# Patient Record
Sex: Female | Born: 1970 | Race: White | Hispanic: Yes | Marital: Married | Smoking: Never smoker
Health system: Southern US, Community
[De-identification: ages and names within clinical notes are randomized; demographics above are authoritative.]

## PROBLEM LIST (undated history)

## (undated) DIAGNOSIS — N186 End stage renal disease: Secondary | ICD-10-CM

## (undated) DIAGNOSIS — I10 Essential (primary) hypertension: Secondary | ICD-10-CM

## (undated) HISTORY — PX: AV FISTULA INSERTION W/ RF MAGNETIC GUIDANCE: CATH118308

---

## 2019-07-27 ENCOUNTER — Other Ambulatory Visit: Payer: Self-pay

## 2019-07-27 ENCOUNTER — Emergency Department (HOSPITAL_COMMUNITY)
Admission: EM | Admit: 2019-07-27 | Discharge: 2019-07-27 | Disposition: A | Discharge status: Home / Self Care | Payer: Self-pay | Attending: Emergency Medicine | Admitting: Emergency Medicine

## 2019-07-27 ENCOUNTER — Emergency Department (HOSPITAL_COMMUNITY): Payer: Self-pay

## 2019-07-27 ENCOUNTER — Encounter (HOSPITAL_COMMUNITY): Payer: Self-pay | Admitting: Student

## 2019-07-27 DIAGNOSIS — R05 Cough: Secondary | ICD-10-CM

## 2019-07-27 DIAGNOSIS — Z20822 Contact with and (suspected) exposure to covid-19: Secondary | ICD-10-CM

## 2019-07-27 DIAGNOSIS — R059 Cough, unspecified: Secondary | ICD-10-CM

## 2019-07-27 DIAGNOSIS — Z992 Dependence on renal dialysis: Secondary | ICD-10-CM | POA: Insufficient documentation

## 2019-07-27 DIAGNOSIS — I120 Hypertensive chronic kidney disease with stage 5 chronic kidney disease or end stage renal disease: Secondary | ICD-10-CM | POA: Insufficient documentation

## 2019-07-27 DIAGNOSIS — N186 End stage renal disease: Secondary | ICD-10-CM | POA: Insufficient documentation

## 2019-07-27 DIAGNOSIS — R509 Fever, unspecified: Secondary | ICD-10-CM

## 2019-07-27 DIAGNOSIS — U071 COVID-19: Secondary | ICD-10-CM | POA: Insufficient documentation

## 2019-07-27 HISTORY — DX: Essential (primary) hypertension: I10

## 2019-07-27 HISTORY — DX: End stage renal disease: N18.6

## 2019-07-27 LAB — COMPREHENSIVE METABOLIC PANEL
ALT: 30 U/L (ref 0–44)
AST: 31 U/L (ref 15–41)
Albumin: 4 g/dL (ref 3.5–5.0)
Alkaline Phosphatase: 139 U/L — ABNORMAL HIGH (ref 38–126)
Anion gap: 14 (ref 5–15)
BUN: 22 mg/dL — ABNORMAL HIGH (ref 6–20)
CO2: 29 mmol/L (ref 22–32)
Calcium: 8.8 mg/dL — ABNORMAL LOW (ref 8.9–10.3)
Chloride: 93 mmol/L — ABNORMAL LOW (ref 98–111)
Creatinine, Ser: 4.4 mg/dL — ABNORMAL HIGH (ref 0.44–1.00)
GFR calc Af Amer: 13 mL/min — ABNORMAL LOW (ref >60)
GFR calc non Af Amer: 11 mL/min — ABNORMAL LOW (ref >60)
Glucose, Bld: 143 mg/dL — ABNORMAL HIGH (ref 70–99)
Potassium: 4.7 mmol/L (ref 3.5–5.1)
Sodium: 136 mmol/L (ref 135–145)
Total Bilirubin: 0.6 mg/dL (ref 0.3–1.2)
Total Protein: 7.4 g/dL (ref 6.5–8.1)

## 2019-07-27 LAB — CBC
HCT: 30 % — ABNORMAL LOW (ref 36.0–46.0)
Hemoglobin: 9.9 g/dL — ABNORMAL LOW (ref 12.0–15.0)
MCH: 34.5 pg — ABNORMAL HIGH (ref 26.0–34.0)
MCHC: 33 g/dL (ref 30.0–36.0)
MCV: 104.5 fL — ABNORMAL HIGH (ref 80.0–100.0)
Platelets: 122 10*3/uL — ABNORMAL LOW (ref 150–400)
RBC: 2.87 MIL/uL — ABNORMAL LOW (ref 3.87–5.11)
RDW: 15 % (ref 11.5–15.5)
WBC: 4.1 10*3/uL (ref 4.0–10.5)
nRBC: 0 % (ref 0.0–0.2)

## 2019-07-27 LAB — INFLUENZA PANEL BY PCR (TYPE A & B)
Influenza A By PCR: NEGATIVE
Influenza B By PCR: NEGATIVE

## 2019-07-27 LAB — SARS CORONAVIRUS 2 (TAT 6-24 HRS): SARS Coronavirus 2: POSITIVE — AB

## 2019-07-27 MED ORDER — ACETAMINOPHEN 325 MG PO TABS
650.0000 mg | ORAL_TABLET | Freq: Once | ORAL | Status: AC
  Administered 2019-07-27: 650 mg via ORAL
  Filled 2019-07-27: qty 2

## 2019-07-27 NOTE — Discharge Instructions (Signed)
You likely have coronavirus, you have a test pending you will be called with positive results in about 1 day.  Please continue to quarantine at home and monitor your symptoms closely. You chest x-ray was clear. Antibiotics are not helpful in treating viral infection, the virus should run its course in about 10-14 days. Please make sure you are drinking plenty of fluids. You can treat your symptoms supportively with tylenol for fevers and pains, and over the counter cough syrups and throat lozenges to help with cough. If your symptoms are not improving please follow up with you Primary doctor.   Please call your dialysis center tomorrow to organize dialysis, you will need to inform them of your coronavirus test results.  I recommend that you purchase a home pulse ox to help better monitor your oxygen at home, if you start to have increased work of breathing or shortness of breath or your oxygen drops below 90% please immediately return to the hospital for reevaluation.  If you develop persistent fevers, shortness of breath or difficulty breathing, chest pain, severe headache and neck pain, persistent nausea and vomiting or other new or concerning symptoms return to the Emergency department.
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
## 2019-07-27 NOTE — ED Notes (Signed)
[employer]

## 2019-07-27 NOTE — ED Triage Notes (Signed)
Pt here from dialysis center with c/o fever and cough , pt did finish her treatment

## 2019-07-27 NOTE — ED Notes (Signed)
Patient verbalizes understanding of discharge instructions. Opportunity for questioning and answers were provided. Armband removed by staff, pt discharged from ED. Pt. ambulatory and discharged home.  

## 2019-07-27 NOTE — ED Triage Notes (Signed)
Pt does endorse a slight cough

## 2019-08-07 ENCOUNTER — Other Ambulatory Visit: Payer: Self-pay

## 2019-08-07 ENCOUNTER — Encounter (HOSPITAL_COMMUNITY): Payer: Self-pay | Admitting: Obstetrics and Gynecology

## 2019-08-07 ENCOUNTER — Emergency Department (HOSPITAL_COMMUNITY)
Admission: EM | Admit: 2019-08-07 | Discharge: 2019-08-07 | Disposition: A | Discharge status: Home / Self Care | Payer: No Typology Code available for payment source | Attending: Emergency Medicine | Admitting: Emergency Medicine

## 2019-08-07 ENCOUNTER — Emergency Department (HOSPITAL_COMMUNITY): Payer: No Typology Code available for payment source

## 2019-08-07 DIAGNOSIS — S199XXA Unspecified injury of neck, initial encounter: Secondary | ICD-10-CM | POA: Diagnosis present

## 2019-08-07 DIAGNOSIS — I120 Hypertensive chronic kidney disease with stage 5 chronic kidney disease or end stage renal disease: Secondary | ICD-10-CM | POA: Diagnosis not present

## 2019-08-07 DIAGNOSIS — Y999 Unspecified external cause status: Secondary | ICD-10-CM | POA: Diagnosis not present

## 2019-08-07 DIAGNOSIS — Y93I9 Activity, other involving external motion: Secondary | ICD-10-CM | POA: Insufficient documentation

## 2019-08-07 DIAGNOSIS — S8012XA Contusion of left lower leg, initial encounter: Secondary | ICD-10-CM | POA: Diagnosis not present

## 2019-08-07 DIAGNOSIS — S161XXA Strain of muscle, fascia and tendon at neck level, initial encounter: Secondary | ICD-10-CM | POA: Insufficient documentation

## 2019-08-07 DIAGNOSIS — Y92410 Unspecified street and highway as the place of occurrence of the external cause: Secondary | ICD-10-CM | POA: Diagnosis not present

## 2019-08-07 DIAGNOSIS — Z992 Dependence on renal dialysis: Secondary | ICD-10-CM | POA: Insufficient documentation

## 2019-08-07 DIAGNOSIS — N186 End stage renal disease: Secondary | ICD-10-CM | POA: Diagnosis not present

## 2019-08-07 MED ORDER — ACETAMINOPHEN 325 MG PO TABS
650.0000 mg | ORAL_TABLET | Freq: Once | ORAL | Status: AC
  Administered 2019-08-07: 650 mg via ORAL
  Filled 2019-08-07: qty 2

## 2019-08-07 NOTE — ED Triage Notes (Signed)
Pt presents to the ED by EMS with complaint of neck pain and left leg pain. Patient is also COVID + Patient is spanish speaking

## 2019-08-07 NOTE — ED Notes (Signed)
Translator used to review DC instructions

## 2019-08-07 NOTE — Discharge Instructions (Addendum)
It was our pleasure to provide your ER care today - we hope that you feel better.  Your xrays were read as showing no acute fracture.   Incidental note was made on your imaging of possible pneumonia related to your COVID infection - continue to follow COVID quarantine/care instructions.   Take acetaminophen as need for pain.  Return to ER if worse, new symptoms, new or severe pain, increased trouble breathing, or other concern.

## 2019-08-07 NOTE — ED Notes (Signed)
X-ray at bedside

## 2019-08-07 NOTE — ED Notes (Signed)
Pt calling to arrange ride home. Public or cab transportation not advised since COVID positive

## 2019-08-07 NOTE — ED Notes (Signed)
Pt verbalizes understanding of DC instructions. Pt belongings returned and is ambulatory out of ED.  
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
## 2021-01-22 IMAGING — DX DG CHEST 1V PORT
1 series · 1 of 1 positions shown · non-contrast
Comparison: None.

CLINICAL DATA: Cough, fever

EXAM:
PORTABLE CHEST 1 VIEW

[chest ap]
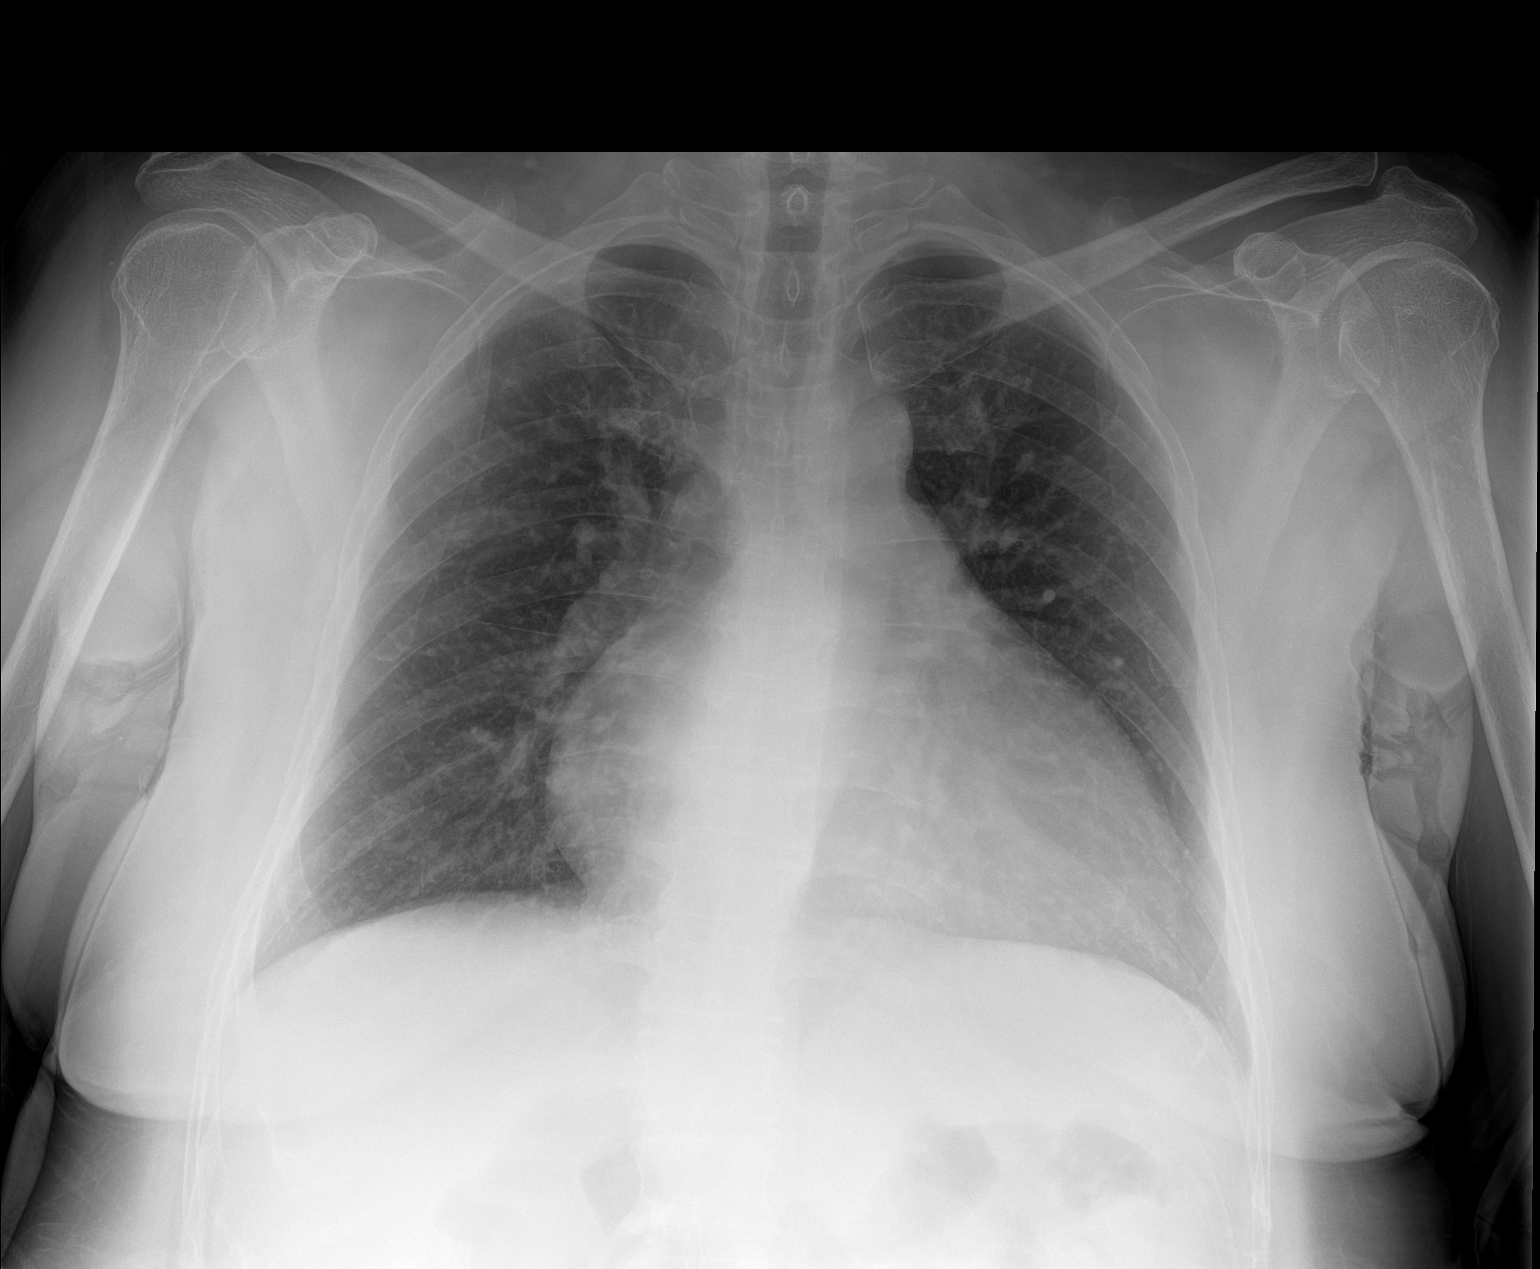

[1 of 1 positions shown; findings below may reference images not displayed]

FINDINGS: Mild cardiomegaly. No focal airspace consolidation, pleural
effusion, or pneumothorax.
IMPRESSION: Mild cardiomegaly. No active pulmonary disease.
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
